# Patient Record
Sex: Male | Born: 1980 | Hispanic: Yes | Marital: Married | Smoking: Current some day smoker
Health system: Southern US, Community
[De-identification: ages and names within clinical notes are randomized; demographics above are authoritative.]

---

## 2012-10-10 ENCOUNTER — Emergency Department: Payer: Self-pay | Admitting: Emergency Medicine

## 2012-10-18 ENCOUNTER — Emergency Department: Payer: Self-pay | Admitting: Emergency Medicine

## 2012-10-18 LAB — BASIC METABOLIC PANEL
BUN: 12 mg/dL (ref 7–18)
Chloride: 106 mmol/L (ref 98–107)
EGFR (African American): >60
Glucose: 111 mg/dL — ABNORMAL HIGH (ref 65–99)
Osmolality: 274 (ref 275–301)
Potassium: 3.2 mmol/L — ABNORMAL LOW (ref 3.5–5.1)

## 2012-10-18 LAB — CBC
HCT: 52.2 % — ABNORMAL HIGH (ref 40.0–52.0)
HGB: 17.8 g/dL (ref 13.0–18.0)
MCH: 32.1 pg (ref 26.0–34.0)
MCHC: 34.2 g/dL (ref 32.0–36.0)
MCV: 94 fL (ref 80–100)
Platelet: 241 10*3/uL (ref 150–440)
RBC: 5.56 10*6/uL (ref 4.40–5.90)
WBC: 17.1 10*3/uL — ABNORMAL HIGH (ref 3.8–10.6)

## 2014-12-19 ENCOUNTER — Emergency Department: Payer: Self-pay

## 2014-12-19 ENCOUNTER — Encounter: Payer: Self-pay | Admitting: Emergency Medicine

## 2014-12-19 ENCOUNTER — Emergency Department
Admission: EM | Admit: 2014-12-19 | Discharge: 2014-12-19 | Disposition: A | Discharge status: Home / Self Care | Payer: Self-pay | Attending: Emergency Medicine | Admitting: Emergency Medicine

## 2014-12-19 DIAGNOSIS — J159 Unspecified bacterial pneumonia: Secondary | ICD-10-CM | POA: Insufficient documentation

## 2014-12-19 DIAGNOSIS — J189 Pneumonia, unspecified organism: Secondary | ICD-10-CM

## 2014-12-19 DIAGNOSIS — F17200 Nicotine dependence, unspecified, uncomplicated: Secondary | ICD-10-CM | POA: Insufficient documentation

## 2014-12-19 LAB — CBC
HEMATOCRIT: 45.2 % (ref 40.0–52.0)
HEMOGLOBIN: 15.8 g/dL (ref 13.0–18.0)
MCH: 32.6 pg (ref 26.0–34.0)
MCHC: 35 g/dL (ref 32.0–36.0)
MCV: 93.1 fL (ref 80.0–100.0)
Platelets: 150 10*3/uL (ref 150–440)
RBC: 4.86 MIL/uL (ref 4.40–5.90)
RDW: 12.5 % (ref 11.5–14.5)
WBC: 8.5 10*3/uL (ref 3.8–10.6)

## 2014-12-19 LAB — BASIC METABOLIC PANEL
ANION GAP: 8 (ref 5–15)
BUN: 11 mg/dL (ref 6–20)
CHLORIDE: 104 mmol/L (ref 101–111)
CO2: 23 mmol/L (ref 22–32)
Calcium: 9.5 mg/dL (ref 8.9–10.3)
Creatinine, Ser: 1.03 mg/dL (ref 0.61–1.24)
GFR calc Af Amer: >60 mL/min (ref >60)
GLUCOSE: 106 mg/dL — AB (ref 65–99)
POTASSIUM: 3.7 mmol/L (ref 3.5–5.1)
Sodium: 135 mmol/L (ref 135–145)

## 2014-12-19 LAB — TROPONIN I: Troponin I: <0.03 ng/mL (ref <0.031)

## 2014-12-19 MED ORDER — ALBUTEROL SULFATE HFA 108 (90 BASE) MCG/ACT IN AERS
2.0000 | INHALATION_SPRAY | RESPIRATORY_TRACT | Status: AC | PRN
Start: 2014-12-19 — End: ?

## 2014-12-19 MED ORDER — AZITHROMYCIN 250 MG PO TABS: ORAL_TABLET | ORAL | Status: AC

## 2014-12-19 NOTE — ED Provider Notes (Signed)
[facility] Emergency Department Provider Note  ____________________________________________  Time seen: Approximately 8:33 PM  I have reviewed the triage vital signs and the nursing notes.   HISTORY  Chief Complaint Chest Pain and Cough   Interpreter was used  HPI Jesse RubensCarlos Aguilar Armstrong is a 34 y.o. male who presents to the emergency department complaining of chest pain, cough, low-grade fever and chills 3 days. He states that symptoms began insidiously and have increased over the intervening period. He is taking over-the-counter medication with no relief. He states the cough is dry. Chest pain is described as a burning sensation in the center of his chest. He denies shortness of breath, headache, visual acuity changes, sore throat, shortness of breath, abdominal pain, nausea or vomiting.   History reviewed. No pertinent past medical history.  There are no active problems to display for this patient.   History reviewed. No pertinent past surgical history.  Current Outpatient Rx  Name  Route  Sig  Dispense  Refill  . albuterol (PROVENTIL HFA;VENTOLIN HFA) 108 (90 BASE) MCG/ACT inhaler   Inhalation   Inhale 2 puffs into the lungs every 4 (four) hours as needed for wheezing or shortness of breath.   1 Inhaler   0   . azithromycin (ZITHROMAX Z-PAK) 250 MG tablet      Take 2 tablets (500 mg) on  Day 1,  followed by 1 tablet (250 mg) once daily on Days 2 through 5.   6 each   0     Allergies Review of patient's allergies indicates no known allergies.  History reviewed. No pertinent family history.  Social History Social History  Substance Use Topics  . Smoking status: Current Some Day Smoker  . Smokeless tobacco: Never Used  . Alcohol Use: Yes     Comment: Weekends    Review of Systems Constitutional: Endorses fever/chills Eyes: No visual changes. ENT: No sore throat. Cardiovascular: Endorses chest pain. Respiratory: Denies shortness of  breath. Nurses cough. Gastrointestinal: No abdominal pain.  No nausea, no vomiting.  No diarrhea.  No constipation. Genitourinary: Negative for dysuria. Musculoskeletal: Negative for back pain. Skin: Negative for rash. Neurological: Negative for headaches, focal weakness or numbness.  10-point ROS otherwise negative.  ____________________________________________   PHYSICAL EXAM:  VITAL SIGNS: ED Triage Vitals  Enc Vitals Group     BP 12/19/14 1858 141/82 mmHg     Pulse Rate 12/19/14 1858 99     Resp 12/19/14 1858 22     Temp 12/19/14 1858 101.4 F (38.6 C)     Temp Source 12/19/14 1858 Oral     SpO2 12/19/14 1858 96 %     Weight 12/19/14 1858 180 lb (81.647 kg)     Height 12/19/14 1858 5\' 7"  (1.702 m)     Head Cir --      Peak Flow --      Pain Score 12/19/14 1859 9     Pain Loc --      Pain Edu? --      Excl. in GC? --     Constitutional: Alert and oriented. Well appearing and in no acute distress. Eyes: Conjunctivae are normal. PERRL. EOMI. Head: Atraumatic. Nose: No congestion/rhinnorhea. Mouth/Throat: Mucous membranes are moist.  Oropharynx non-erythematous. Neck: No stridor.   Hematological/Lymphatic/Immunilogical: Diffuse, mobile, nontender anterior cervical lymphadenopathy. Cardiovascular: Normal rate, regular rhythm. Grossly normal heart sounds.  Good peripheral circulation. Respiratory: Normal respiratory effort.  No retractions. Lungs with Scattered coarse breath sounds bilaterally. No rales or  rhonchi. No wheezing. No absent or decreased breath sounds. Gastrointestinal: Soft and nontender. No distention. No abdominal bruits. No CVA tenderness. Musculoskeletal: No lower extremity tenderness nor edema.  No joint effusions. Neurologic:  Normal speech and language. No gross focal neurologic deficits are appreciated. No gait instability. Skin:  Skin is warm, dry and intact. No rash noted. Psychiatric: Mood and affect are normal. Speech and behavior are  normal.  ____________________________________________   LABS (all labs ordered are listed, but only abnormal results are displayed)  Labs Reviewed  BASIC METABOLIC PANEL - Abnormal; Notable for the following:    Glucose, Bld 106 (*)    All other components within normal limits  CBC  TROPONIN I   ____________________________________________  EKG  EKG reveals normal sinus rhythm. No ST elevation or depression. PR, QRS, QT intervals within normal limits. No Q waves or delta waves present. ____________________________________________  RADIOLOGY  Chest x-ray Impression: No acute infiltrate or pulmonary edema. Increased bronchial markings without focal consolidation. ____________________________________________   PROCEDURES  Procedure(s) performed: None  Critical Care performed: No  ____________________________________________   INITIAL IMPRESSION / ASSESSMENT AND PLAN / ED COURSE  Pertinent labs & imaging results that were available during my care of the patient were reviewed by me and considered in my medical decision making (see chart for details).  Patient's history, symptoms, physical exam, laboratory results, EKG, and chest x-ray are consistent with community-acquired pneumonia. Advised patient of findings and diagnosis they verbalized understanding same. Patient will be placed on albuterol and antibiotics for symptom control. Patient verbalizes understanding of diagnosis and treatment plan and verbalizes compliance with same. ____________________________________________   FINAL CLINICAL IMPRESSION(S) / ED DIAGNOSES  Final diagnoses:  Community acquired pneumonia      Racheal Patches, PA-C 12/19/14 2042  Loleta Rose, MD 12/19/14 2322

## 2014-12-19 NOTE — ED Provider Notes (Signed)
-----------------------------------------   7:24 PM on 12/19/2014 -----------------------------------------  EKG reviewed and interpreted by myself shows normal sinus rhythm at 95 bpm, narrow QRS, normal axis, normal intervals, nonspecific ST changes. Overall non-concerning EKG  Minna AntisKevin Chablis Losh, MD 12/19/14 [id number]

## 2014-12-19 NOTE — ED Notes (Signed)
Pt presents with c/o chest tightness, cough, fever, and chills since yesterday. Pt states tightness in chest 9/10 that is in the middle of his chest.

## 2014-12-19 NOTE — Discharge Instructions (Signed)
Neumonía extrahospitalaria en los adultos °(Community-Acquired Pneumonia, Adult) °La neumonía es una infección en los pulmones. Hay diferentes tipos de neumonía. Uno de ellos puede desarrollarse mientras una persona está en el hospital. Un tipo diferente, llamado neumonía extrahospitalaria, evoluciona en las personas que no están o no han estado recientemente en el hospital u otro centro de salud.  °CAUSAS °La neumonía puede ser bacteriana, viral o micótica. Con frecuencia, la causa de la neumonía extrahospitalaria es la bacteria Streptococcus pneumoniae. Estas bacterias suelen transmitirse de una persona a otra al inhalar las gotitas que un individuo infectado libera al toser o estornudar. °FACTORES DE RIESGO °Es más probable que la enfermedad se manifieste en: °· Las personas que padecen enfermedades crónicas, como enfermedad pulmonar obstructiva crónica (EPOC), asma, insuficiencia cardíaca congestiva, fibroso quística, diabetes o enfermedad renal. °· Las personas que tienen el VIH en etapa inicial o avanzada. °· Las personas que tienen anemia drepanocítica. °· Las personas a las que se les extrajo el bazo (esplenectomía). °· Las personas cuya higiene dental es deficiente. °· Las personas que sufren enfermedades que aumentan el riesgo de inspirar (aspirar) las secreciones de la propia boca y la nariz.   °· Las personas cuyo sistema inmunitario está debilitado (inmunodeprimido). °· Los fumadores. °· Las personas que viajan a regiones donde es frecuente la existencia de los gérmenes que causan la neumonía. °· Las personas que tienen contacto con hábitat de animales o con animales que son portadores de los gérmenes que causan neumonía, entre ellos, pájaros, murciélagos, conejos, gatos y animales de granja. °SÍNTOMAS °Los síntomas de esta enfermedad incluyen lo siguiente: °· Tos seca. °· Tos con expectoración (productiva). °· Fiebre. °· Sudoración. °· Dolor de pecho, especialmente al respirar profundamente o al  toser. °· Respiración rápida o dificultad para respirar. °· Falta de aire. °· Escalofríos. °· Fatiga. °· Dolores musculares. °DIAGNÓSTICO °El médico le hará una historia clínica y un examen físico. También pueden hacerle otros estudios, por ejemplo: °· Estudios de diagnóstico por imágenes, entre ellos, radiografías. °· Análisis para controlar el nivel de oxígeno y de otros gases en la sangre. °· Otros análisis de sangre, de la mucosidad (esputo), el líquido que rodea los pulmones (líquido pleural) y la orina. °Si la neumonía es grave, se pueden hacer otros estudios para identificar la causa específica de la enfermedad. °TRATAMIENTO °El tipo de tratamiento que se administra depende de muchos factores, por ejemplo, la causa de la neumonía, los medicamentos que toma y otras enfermedades que padezca. En el caso de la mayoría de los adultos, el tratamiento de la neumonía y la recuperación pueden hacerse en la casa. En algunos casos, el tratamiento debe administrarse en un hospital. El tratamiento puede incluir lo siguiente: °· Antibióticos, si la neumonía fue causada por bacterias. °· Antivirales, si la neumonía fue causada por un virus. °· Medicamentos que se administran por vía oral o por vía intravenosa (IV). °· Oxígeno. °· Terapia respiratoria. °Aunque es poco frecuente, el tratamiento de la neumonía grave puede incluir lo siguiente: °· Asistencia respiratoria mecánica. Este tratamiento se realiza si no respira bien por sí solo y no puede mantener un nivel de oxígeno adecuado. °· Toracocentesis. Este procedimiento se realiza para extraer el líquido que rodea uno o ambos pulmones, a fin de mejorar la respiración. °INSTRUCCIONES PARA EL CUIDADO EN EL HOGAR °· Tome los medicamentos de venta libre y los recetados solamente como se lo haya indicado el médico. °· Tome los medicamentos para la tos solamente si no puede dormir bien. Debe entender que   los medicamentos para la tos pueden minar la capacidad natural del  organismo de eliminar la mucosidad de los pulmones. °· Si le recetaron un antibiótico, tómelo como se lo haya indicado el médico. No deje de tomar los antibióticos aunque comience a sentirse mejor. °· De noche, duerma semisentado. Intente dormir en un sillón reclinable o póngase algunas almohadas debajo de la cabeza. °· No consuma productos que contengan tabaco, incluidos cigarrillos, tabaco de mascar y cigarrillos electrónicos. Si necesita ayuda para dejar de fumar, consulte al médico. °· Beba suficiente agua para mantener la orina clara o de color amarillo pálido. Esto ayudará a fluidificar las secreciones mucosas de los pulmones. °PREVENCIÓN °Existen formas de reducir el riesgo de tener neumonía extrahospitalaria. Considere la posibilidad de aplicarse la vacuna antineumocócica reúne estos requisitos: °· Es mayor de 65 años. °· Es mayor de 19 años y está recibiendo tratamiento oncológico, tiene enfermedad pulmonar crónica u otros trastornos que le afectan el sistema inmunitario. Pregúntele al médico si esto es válido para su caso. °Hay diferentes tipos de vacunas antineumocócicas y cronogramas para su aplicación. Pregúntele al médico cuál es la opción de vacunación más adecuada para usted. °También puede evitar contraer neumonía extrahospitalaria si toma las siguientes medidas: °· Se aplica la vacuna antigripal todos los años. Pregúntele al médico cuál es el tipo de vacuna antigripal más adecuado para usted. °· Visita al dentista periódicamente. °· Se lava las manos con frecuencia. Utilice un desinfectante para manos si no dispone de agua y jabón. °SOLICITE ATENCIÓN MÉDICA SI: °· Tiene fiebre. °· No duerme bien porque no es posible controlar la tos con medicamentos para la tos. °SOLICITE ATENCIÓN MÉDICA DE INMEDIATO SI: °· Experimenta un empeoramiento en la falta de aire. °· El dolor de pecho es cada vez más intenso. °· La enfermedad empeora, especialmente si usted es un adulto mayor o su sistema inmunitario está  debilitado. °· Tose y escupe sangre. °  °Esta información no tiene como fin reemplazar el consejo del médico. Asegúrese de hacerle al médico cualquier pregunta que tenga. °  °Document Released: 10/19/2004 Document Revised: 09/30/2014 °Elsevier Interactive Patient Education ©2016 [employer] ° ° °

## 2014-12-19 NOTE — ED Notes (Signed)
Interpreter requested at this time to triage.

## 2014-12-19 NOTE — ED Notes (Signed)
Patient with no complaints at this time. Respirations even and unlabored. Skin warm/dry. Discharge instructions reviewed with patient at this time. Patient given opportunity to voice concerns/ask questions. Patient discharged at this time and left Emergency Department with steady gait.   

## 2017-04-12 ENCOUNTER — Emergency Department
Admission: EM | Admit: 2017-04-12 | Discharge: 2017-04-12 | Disposition: A | Discharge status: Home / Self Care | Payer: Self-pay | Attending: Emergency Medicine | Admitting: Emergency Medicine

## 2017-04-12 ENCOUNTER — Emergency Department: Payer: Self-pay

## 2017-04-12 ENCOUNTER — Encounter: Payer: Self-pay | Admitting: Emergency Medicine

## 2017-04-12 ENCOUNTER — Other Ambulatory Visit: Payer: Self-pay

## 2017-04-12 DIAGNOSIS — J111 Influenza due to unidentified influenza virus with other respiratory manifestations: Secondary | ICD-10-CM | POA: Insufficient documentation

## 2017-04-12 DIAGNOSIS — J101 Influenza due to other identified influenza virus with other respiratory manifestations: Secondary | ICD-10-CM

## 2017-04-12 DIAGNOSIS — F17210 Nicotine dependence, cigarettes, uncomplicated: Secondary | ICD-10-CM | POA: Insufficient documentation

## 2017-04-12 LAB — CBC
HEMATOCRIT: 47.3 % (ref 40.0–52.0)
Hemoglobin: 16.6 g/dL (ref 13.0–18.0)
MCH: 32.8 pg (ref 26.0–34.0)
MCHC: 35.2 g/dL (ref 32.0–36.0)
MCV: 93.2 fL (ref 80.0–100.0)
PLATELETS: 157 10*3/uL (ref 150–440)
RBC: 5.07 MIL/uL (ref 4.40–5.90)
RDW: 12.6 % (ref 11.5–14.5)
WBC: 6.5 10*3/uL (ref 3.8–10.6)

## 2017-04-12 LAB — BASIC METABOLIC PANEL
Anion gap: 10 (ref 5–15)
BUN: 12 mg/dL (ref 6–20)
CHLORIDE: 109 mmol/L (ref 101–111)
CO2: 18 mmol/L — AB (ref 22–32)
CREATININE: 1.17 mg/dL (ref 0.61–1.24)
Calcium: 9 mg/dL (ref 8.9–10.3)
GFR calc Af Amer: >60 mL/min (ref >60)
GFR calc non Af Amer: >60 mL/min (ref >60)
GLUCOSE: 96 mg/dL (ref 65–99)
Potassium: 3.8 mmol/L (ref 3.5–5.1)
Sodium: 137 mmol/L (ref 135–145)

## 2017-04-12 LAB — TROPONIN I: Troponin I: <0.03 ng/mL (ref <0.03)

## 2017-04-12 LAB — INFLUENZA PANEL BY PCR (TYPE A & B)
Influenza A By PCR: POSITIVE — AB
Influenza B By PCR: NEGATIVE

## 2017-04-12 MED ORDER — OSELTAMIVIR PHOSPHATE 75 MG PO CAPS: 75.0000 mg | ORAL_CAPSULE | Freq: Two times a day (BID) | ORAL | 0 refills | Status: AC

## 2017-04-12 MED ORDER — HYDROCOD POLST-CPM POLST ER 10-8 MG/5ML PO SUER
ORAL | Status: AC
  Administered 2017-04-12: 5 mL via ORAL
  Filled 2017-04-12: qty 5

## 2017-04-12 MED ORDER — HYDROCOD POLST-CPM POLST ER 10-8 MG/5ML PO SUER: 5.0000 mL | Freq: Two times a day (BID) | ORAL | 0 refills | Status: AC

## 2017-04-12 MED ORDER — HYDROCOD POLST-CPM POLST ER 10-8 MG/5ML PO SUER
5.0000 mL | Freq: Once | ORAL | Status: AC
  Administered 2017-04-12: 5 mL via ORAL

## 2017-04-12 NOTE — ED Notes (Signed)
Pt in NAD when brought back to treatment room. Wheelchair used and pt in mask. Pt is diaphoretic but no SOB or increased WOB noted. Pt able to stand and pivot from wheelchair to treatment bed. Bed locked and in lowest position. Call bell in reach. Family at bedside.

## 2017-04-12 NOTE — ED Provider Notes (Signed)
[facility] Emergency Department Provider Note       Time seen: ----------------------------------------- 4:05 PM on 04/12/2017 -----------------------------------------   I have reviewed the triage vital signs and the nursing notes.  HISTORY   Chief Complaint Chest Pain    HPI Ileene RubensCarlos Aguilar Ramirez is a 37 y.o. male with no significant past medical history who presents to the ED for chest pain.  Patient states symptoms started yesterday.  He feels like his chest pain started before his cough.  He has had significant and persistent coughing.  Yesterday he had sputum production but none today.  Chest pain is worse now with coughing.  He has had body aches and possibly a fever.  He denies vomiting or diarrhea but does have a sore throat  History reviewed. No pertinent past medical history.  There are no active problems to display for this patient.   History reviewed. No pertinent surgical history.  Allergies Patient has no known allergies.  Social History Social History   Tobacco Use  . Smoking status: Current Some Day Smoker  . Smokeless tobacco: Never Used  Substance Use Topics  . Alcohol use: Yes    Comment: Weekends  . Drug use: No    Review of Systems Constitutional: Positive for fever ENT: Positive for congestion and sore throat Cardiovascular: Positive for chest pain Respiratory: Negative for shortness of breath. Gastrointestinal: Negative for abdominal pain, vomiting and diarrhea. Musculoskeletal: Negative for back pain.  Positive for muscle aches Skin: Negative for rash. Neurological: Negative for headaches, focal weakness or numbness.  All systems negative/normal/unremarkable except as stated in the HPI  ____________________________________________   PHYSICAL EXAM:  VITAL SIGNS: ED Triage Vitals  Enc Vitals Group     BP 04/12/17 1316 124/79     Pulse Rate 04/12/17 1316 (!) 106     Resp 04/12/17 1316 20     Temp  04/12/17 1316 98.6 F (37 C)     Temp Source 04/12/17 1316 Oral     SpO2 04/12/17 1316 99 %     Weight 04/12/17 1315 190 lb (86.2 kg)     Height 04/12/17 1315 5\' 11"  (1.803 m)     Head Circumference --      Peak Flow --      Pain Score 04/12/17 1310 9     Pain Loc --      Pain Edu? --      Excl. in GC? --    Constitutional: Alert and oriented. Well appearing and in no distress. Eyes: Conjunctivae are normal. Normal extraocular movements. ENT   Head: Normocephalic and atraumatic.   Nose: No congestion/rhinnorhea.   Mouth/Throat: Mucous membranes are moist.   Neck: No stridor. Cardiovascular: Normal rate, regular rhythm. No murmurs, rubs, or gallops. Respiratory: Normal respiratory effort without tachypnea nor retractions. Breath sounds are clear and equal bilaterally. No wheezes/rales/rhonchi. Gastrointestinal: Soft and nontender. Normal bowel sounds Musculoskeletal: Nontender with normal range of motion in extremities. No lower extremity tenderness nor edema. Neurologic:  Normal speech and language. No gross focal neurologic deficits are appreciated.  Skin:  Skin is warm, dry and intact. No rash noted. Psychiatric: Mood and affect are normal. Speech and behavior are normal.  ____________________________________________  EKG: Interpreted by me.  Sinus rhythm the rate of 91 bpm, normal PR interval, normal QRS, normal QT  ____________________________________________  ED COURSE:  As part of my medical decision making, I reviewed the following data within the electronic MEDICAL RECORD NUMBER History obtained from family if  available, nursing notes, old chart and ekg, as well as notes from prior ED visits. Patient presented for chest pain and flulike symptoms, we will assess with labs and imaging as indicated at this time.   Procedures ____________________________________________   LABS (pertinent positives/negatives)  Labs Reviewed  BASIC METABOLIC PANEL - Abnormal;  Notable for the following components:      Result Value   CO2 18 (*)    All other components within normal limits  INFLUENZA PANEL BY PCR (TYPE A & B) - Abnormal; Notable for the following components:   Influenza A By PCR POSITIVE (*)    All other components within normal limits  CBC  TROPONIN I    RADIOLOGY Images were viewed by me  Chest x-ray is normal  ____________________________________________  DIFFERENTIAL DIAGNOSIS   Influenza, URI, bronchitis, pneumonia, chest wall pain  FINAL ASSESSMENT AND PLAN  Influenza   Plan: The patient had presented for chest pain and cough. Patient's labs were normal with the exception of serum bicarb and positive influenza test. Patient's imaging was negative.  Patient will be discharged with Tamiflu and Tussionex.  He is stable for outpatient follow-up.   Ulice Dash, MD   Note: This note was generated in part or whole with voice recognition software. Voice recognition is usually quite accurate but there are transcription errors that can and very often do occur. I apologize for any typographical errors that were not detected and corrected.     Emily Filbert, MD 04/12/17 [phone]

## 2017-04-12 NOTE — ED Triage Notes (Signed)
Pt here for chest pain. Coughing entire triage but reports chest pain started before cough. No fevers.  Reports sweating before arrival to ED, none noted currently.  No fever known.  Pain is constant.

## 2017-11-17 IMAGING — CR DG CHEST 2V
2 series · 2 of 2 positions shown · non-contrast
Comparison: None.

CLINICAL DATA: Chest pain, cough, fever, chills

EXAM:
CHEST  2 VIEW

[chest pa]
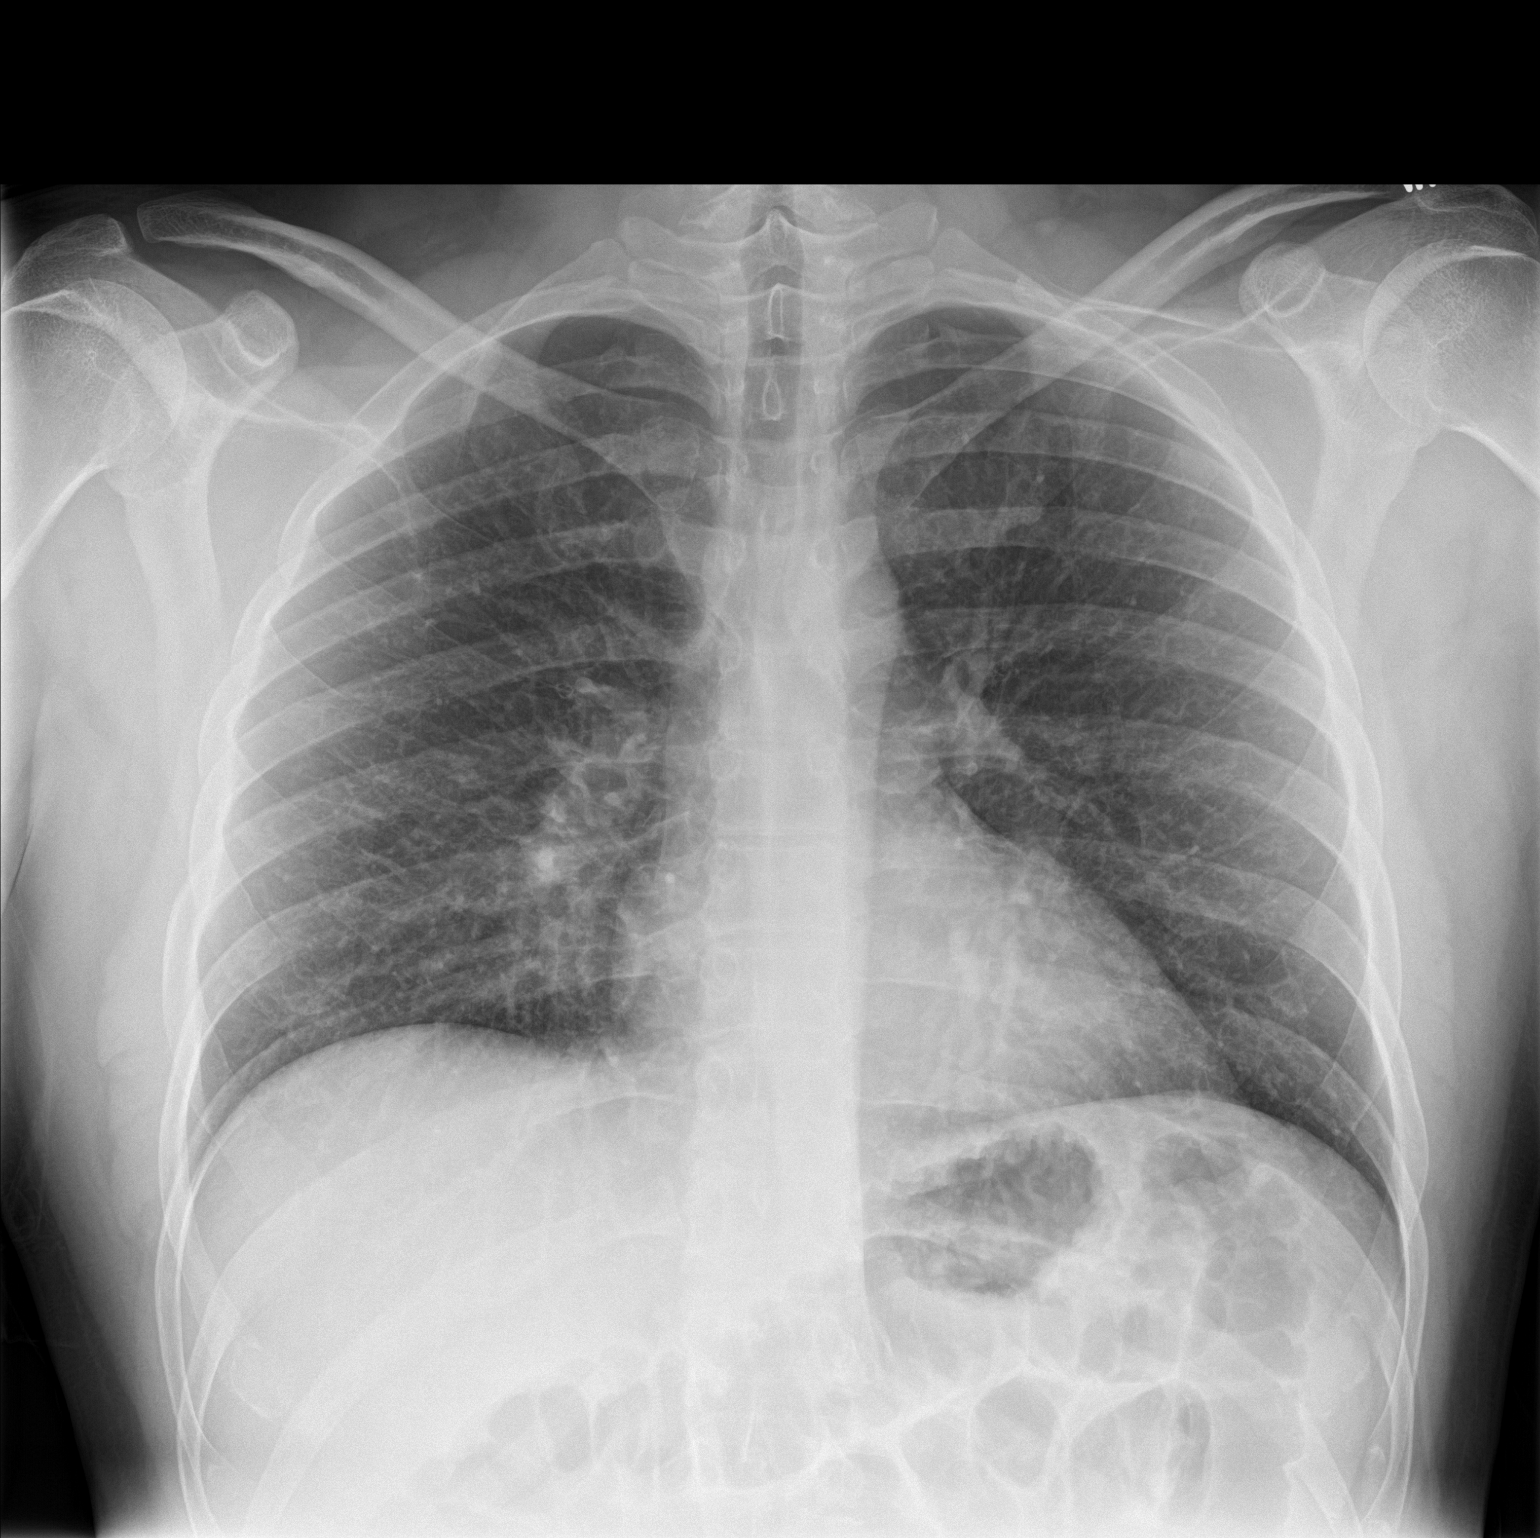

[chest lat]
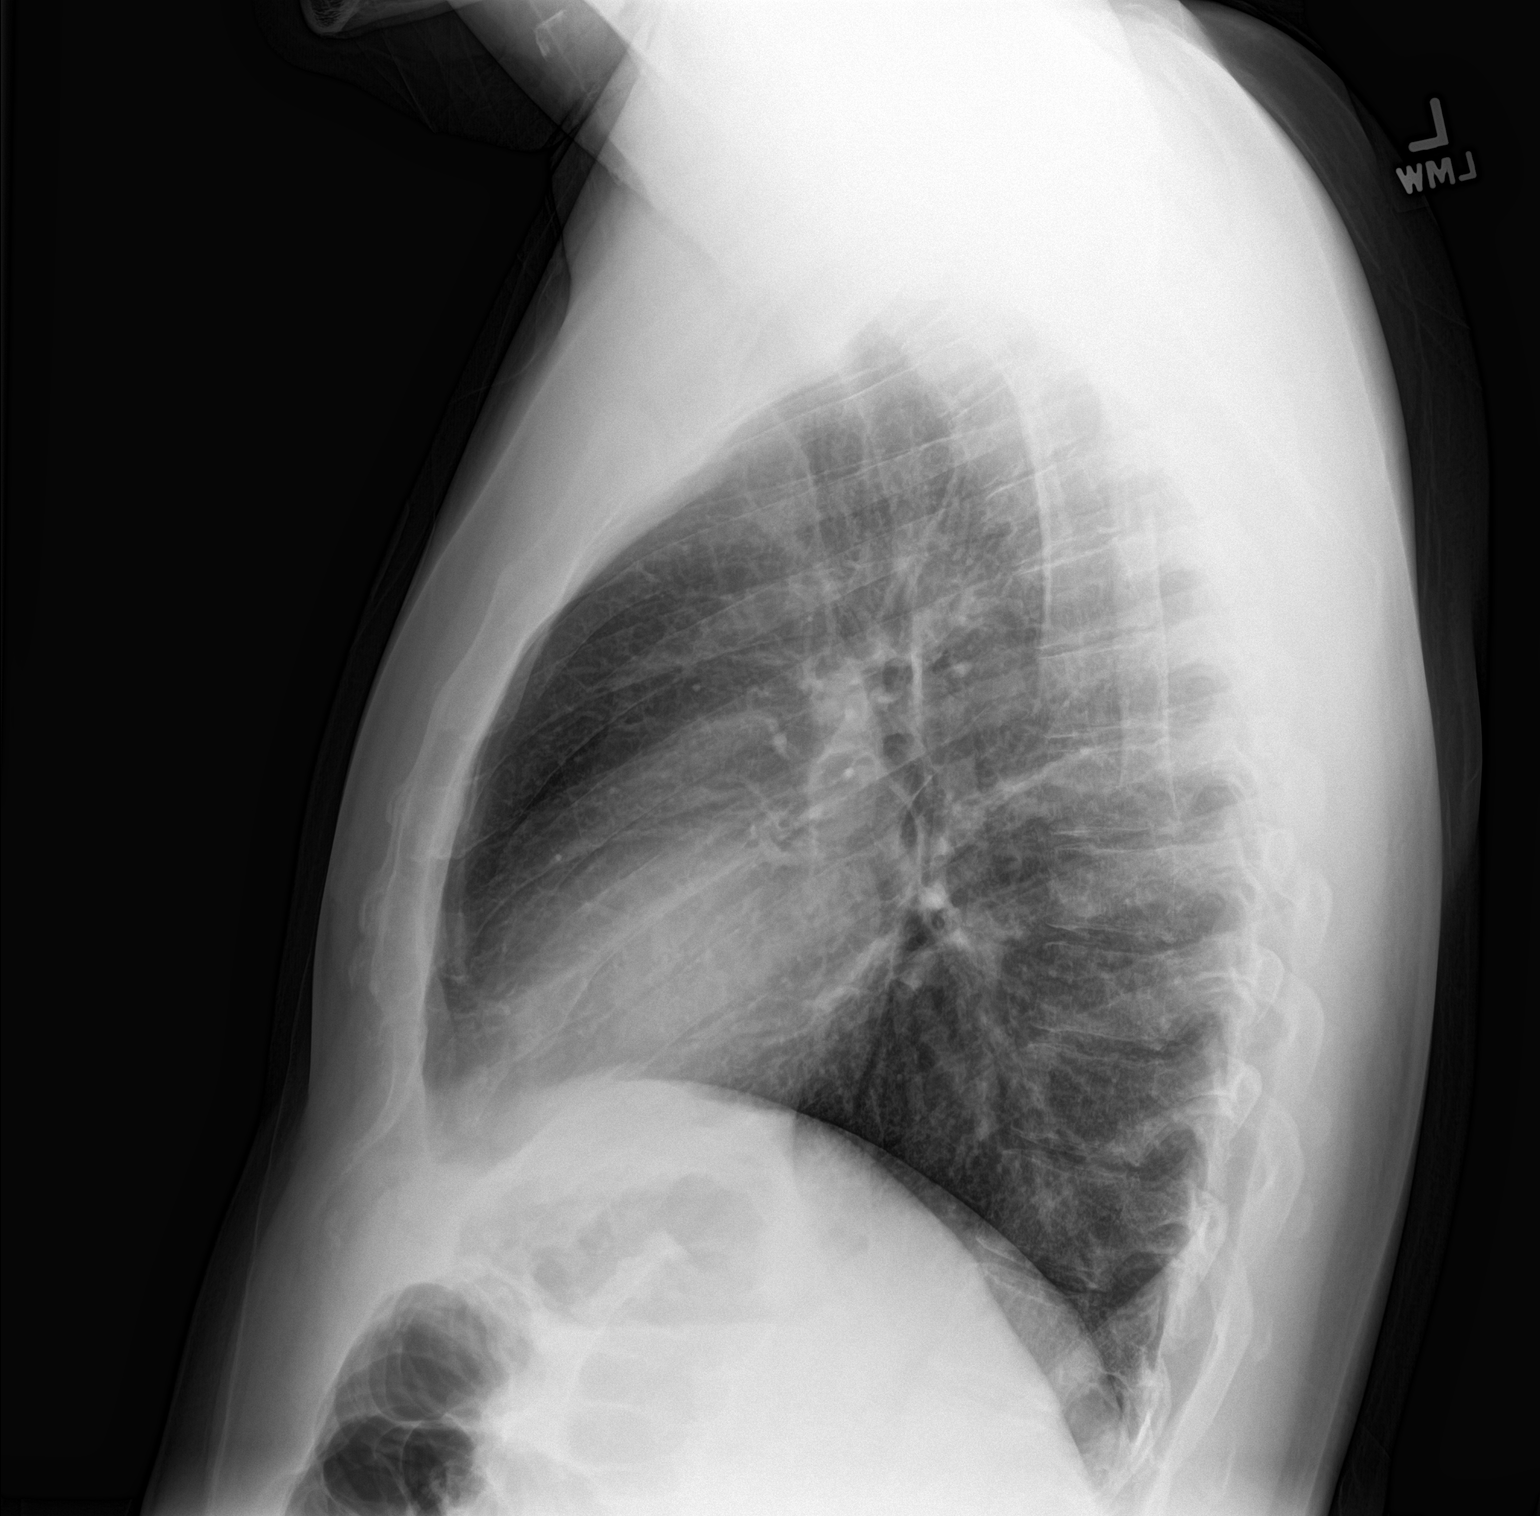

[2 of 2 positions shown; findings below may reference images not displayed]

FINDINGS: Cardiomediastinal silhouette is unremarkable. No acute infiltrate or
pleural effusion. No pulmonary edema. Mild infrahilar increased
bronchial markings without focal consolidation.
IMPRESSION: No acute infiltrate or pulmonary edema. Mild infrahilar increased
bronchial markings without focal consolidation.

## 2018-03-21 ENCOUNTER — Encounter: Payer: Self-pay | Admitting: Emergency Medicine

## 2018-03-21 ENCOUNTER — Other Ambulatory Visit: Payer: Self-pay

## 2018-03-21 ENCOUNTER — Emergency Department
Admission: EM | Admit: 2018-03-21 | Discharge: 2018-03-21 | Disposition: A | Discharge status: Home / Self Care | Payer: Self-pay | Attending: Emergency Medicine | Admitting: Emergency Medicine

## 2018-03-21 DIAGNOSIS — W228XXA Striking against or struck by other objects, initial encounter: Secondary | ICD-10-CM | POA: Insufficient documentation

## 2018-03-21 DIAGNOSIS — Y93H9 Activity, other involving exterior property and land maintenance, building and construction: Secondary | ICD-10-CM | POA: Insufficient documentation

## 2018-03-21 DIAGNOSIS — Y929 Unspecified place or not applicable: Secondary | ICD-10-CM | POA: Insufficient documentation

## 2018-03-21 DIAGNOSIS — Z79899 Other long term (current) drug therapy: Secondary | ICD-10-CM | POA: Insufficient documentation

## 2018-03-21 DIAGNOSIS — T1502XA Foreign body in cornea, left eye, initial encounter: Secondary | ICD-10-CM | POA: Insufficient documentation

## 2018-03-21 DIAGNOSIS — F17200 Nicotine dependence, unspecified, uncomplicated: Secondary | ICD-10-CM | POA: Insufficient documentation

## 2018-03-21 DIAGNOSIS — Y990 Civilian activity done for income or pay: Secondary | ICD-10-CM | POA: Insufficient documentation

## 2018-03-21 MED ORDER — FLUORESCEIN-BENOXINATE 0.25-0.4 % OP SOLN
1.0000 [drp] | Freq: Once | OPHTHALMIC | Status: AC
  Administered 2018-03-21: 1 [drp] via OPHTHALMIC
  Filled 2018-03-21: qty 5

## 2018-03-21 MED ORDER — TETRACAINE HCL 0.5 % OP SOLN
1.0000 [drp] | Freq: Once | OPHTHALMIC | Status: AC
  Administered 2018-03-21: 1 [drp] via OPHTHALMIC
  Filled 2018-03-21: qty 4

## 2018-03-21 MED ORDER — EYE WASH OPHTH SOLN
1.0000 [drp] | OPHTHALMIC | Status: DC | PRN
Start: 2018-03-21 — End: 2018-03-22
  Administered 2018-03-21: 1 [drp] via OPHTHALMIC
  Filled 2018-03-21: qty 118

## 2018-03-21 MED ORDER — BACITRACIN-POLYMYXIN B 500-10000 UNIT/GM OP OINT
TOPICAL_OINTMENT | Freq: Once | OPHTHALMIC | Status: AC
  Administered 2018-03-21: 21:00:00 via OPHTHALMIC
  Filled 2018-03-21: qty 3.5

## 2018-03-21 NOTE — ED Notes (Signed)
Pt unable to provide chain of custody from employer for workers comp as well as we are unable to find employer in out profile system. Pt informed to reach out to supervisor due to workers comp needs. Pt provides verbal understanding.

## 2018-03-21 NOTE — Discharge Instructions (Addendum)
Apply eye ointment before you go to sleep.  At 8:30 in the morning call ophthalmology and tell them you follow-up in the emergency room and it would tell you what time to come and see Dr. Lara Mulch or his associate to have foreign body removed.

## 2018-03-21 NOTE — ED Triage Notes (Signed)
Patient ambulatory to triage with steady gait, without difficulty or distress noted; per [facility] interpreter, pt reports yesterday at work, got something in eye, c/o left eye pain; pt employed with [employer] [phone] office)--employer not in our workers comp profile; called office with no answer, called supervisor Brain Hilts [phone]) with no answer, message left to return call; visual acuity 20/20 right eye, 20/25 left eye

## 2018-03-21 NOTE — ED Provider Notes (Signed)
[facility] Emergency Department Provider Note   ____________________________________________   First MD Initiated Contact with Patient 03/21/18 [phone]     (approximate)  I have reviewed the triage vital signs and the nursing notes.   HISTORY  Chief Complaint No chief complaint on file.    HPI Jesse Armstrong is a 38 y.o. male patient presents with foreign body left eye for 2 days.   Patient states while installing air-conditioning duct insulation fell into his eye.  Patient denies vision changes.  Patient was seen by urgent care clinic and sent to this department for removal.   History reviewed. No pertinent past medical history.  There are no active problems to display for this patient.   History reviewed. No pertinent surgical history.  Prior to Admission medications   Medication Sig Start Date End Date Taking? Authorizing Provider  albuterol (PROVENTIL HFA;VENTOLIN HFA) 108 (90 BASE) MCG/ACT inhaler Inhale 2 puffs into the lungs every 4 (four) hours as needed for wheezing or shortness of breath. 12/19/14   Cuthriell, Delorise Royals, PA-C  azithromycin (ZITHROMAX Z-PAK) 250 MG tablet Take 2 tablets (500 mg) on  Day 1,  followed by 1 tablet (250 mg) once daily on Days 2 through 5. 12/19/14   Cuthriell, Delorise Royals, PA-C  chlorpheniramine-HYDROcodone (TUSSIONEX PENNKINETIC ER) 10-8 MG/5ML SUER Take 5 mLs by mouth 2 (two) times daily. 04/12/17   Emily Filbert, MD    Allergies Patient has no known allergies.  No family history on file.  Social History Social History   Tobacco Use  . Smoking status: Current Some Day Smoker  . Smokeless tobacco: Never Used  Substance Use Topics  . Alcohol use: Yes    Comment: Weekends  . Drug use: No    Review of Systems Constitutional: No fever/chills Eyes: No visual changes.  Foreign body left eye. ENT: No sore throat. Cardiovascular: Denies chest pain. Respiratory: Denies shortness of  breath. Gastrointestinal: No abdominal pain.  No nausea, no vomiting.  No diarrhea.  No constipation. Genitourinary: Negative for dysuria. Musculoskeletal: Negative for back pain. Skin: Negative for rash. Neurological: Negative for headaches, focal weakness or numbness.   ____________________________________________   PHYSICAL EXAM:  VITAL SIGNS: ED Triage Vitals  Enc Vitals Group     BP 03/21/18 1908 (!) 133/100     Pulse Rate 03/21/18 1908 70     Resp 03/21/18 1908 18     Temp 03/21/18 1908 98.2 F (36.8 C)     Temp Source 03/21/18 1908 Oral     SpO2 03/21/18 1908 99 %     Weight 03/21/18 1906 196 lb (88.9 kg)     Height 03/21/18 1906 5\' 8"  (1.727 m)     Head Circumference --      Peak Flow --      Pain Score 03/21/18 1906 8     Pain Loc --      Pain Edu? --      Excl. in GC? --     Constitutional: Alert and oriented. Well appearing and in no acute distress. Eyes: Conjunctivae are normal. PERRL. EOMI. see visual acuity in chart.  Visible foreign body left eye. Hematological/Lymphatic/Immunilogical: No cervical lymphadenopathy. Cardiovascular: Normal rate, regular rhythm. Grossly normal heart sounds.  Good peripheral circulation. Respiratory: Normal respiratory effort.  No retractions. Lungs CTAB. Skin:  Skin is warm, dry and intact. No rash noted. Psychiatric: Mood and affect are normal. Speech and behavior are normal.  ____________________________________________   LABS (all labs  ordered are listed, but only abnormal results are displayed)  Labs Reviewed - No data to display ____________________________________________  EKG   ____________________________________________  RADIOLOGY  ED MD interpretation:    Official radiology report(s): No results found.  ____________________________________________   PROCEDURES  Procedure(s) performed (including Critical Care):  Procedures   ____________________________________________   INITIAL IMPRESSION  / ASSESSMENT AND PLAN / ED COURSE  As part of my medical decision making, I reviewed the following data within the electronic MEDICAL RECORD NUMBER     Foreign body left eye.  I was unable to remove foreign body using Q-tip and blunt needle.  Discussed patient with on-call ophthalmologist (Dr. Lara Mulch).  Advised to put eye ointment and have patient report in the morning for definitive evaluation and treatment.      ____________________________________________   FINAL CLINICAL IMPRESSION(S) / ED DIAGNOSES  Final diagnoses:  Corneal foreign body with residual material, left, initial encounter     ED Discharge Orders    None       Note:  This document was prepared using Dragon voice recognition software and may include unintentional dictation errors.    Joni Reining, PA-C 03/21/18 2010    Dionne Bucy, MD 03/21/18 2326

## 2018-03-21 NOTE — ED Triage Notes (Signed)
First Nurse Note:  Patient has foreign body to left eye per [facility].  Patient ambulatory to triage.  No obvious distress at this time.

## 2018-03-21 NOTE — ED Notes (Signed)
Interpreter requested 

## 2020-03-11 IMAGING — CR DG CHEST 2V
1 series · 2 of 2 positions shown · non-contrast
Comparison: 12/19/2014

CLINICAL DATA: Chest pain, cough, and fever.

EXAM:
CHEST - 2 VIEW

[Series 1: dg chest 2 view · 0.14mm/px · 2 of 2 slices shown]
[im 1/2]
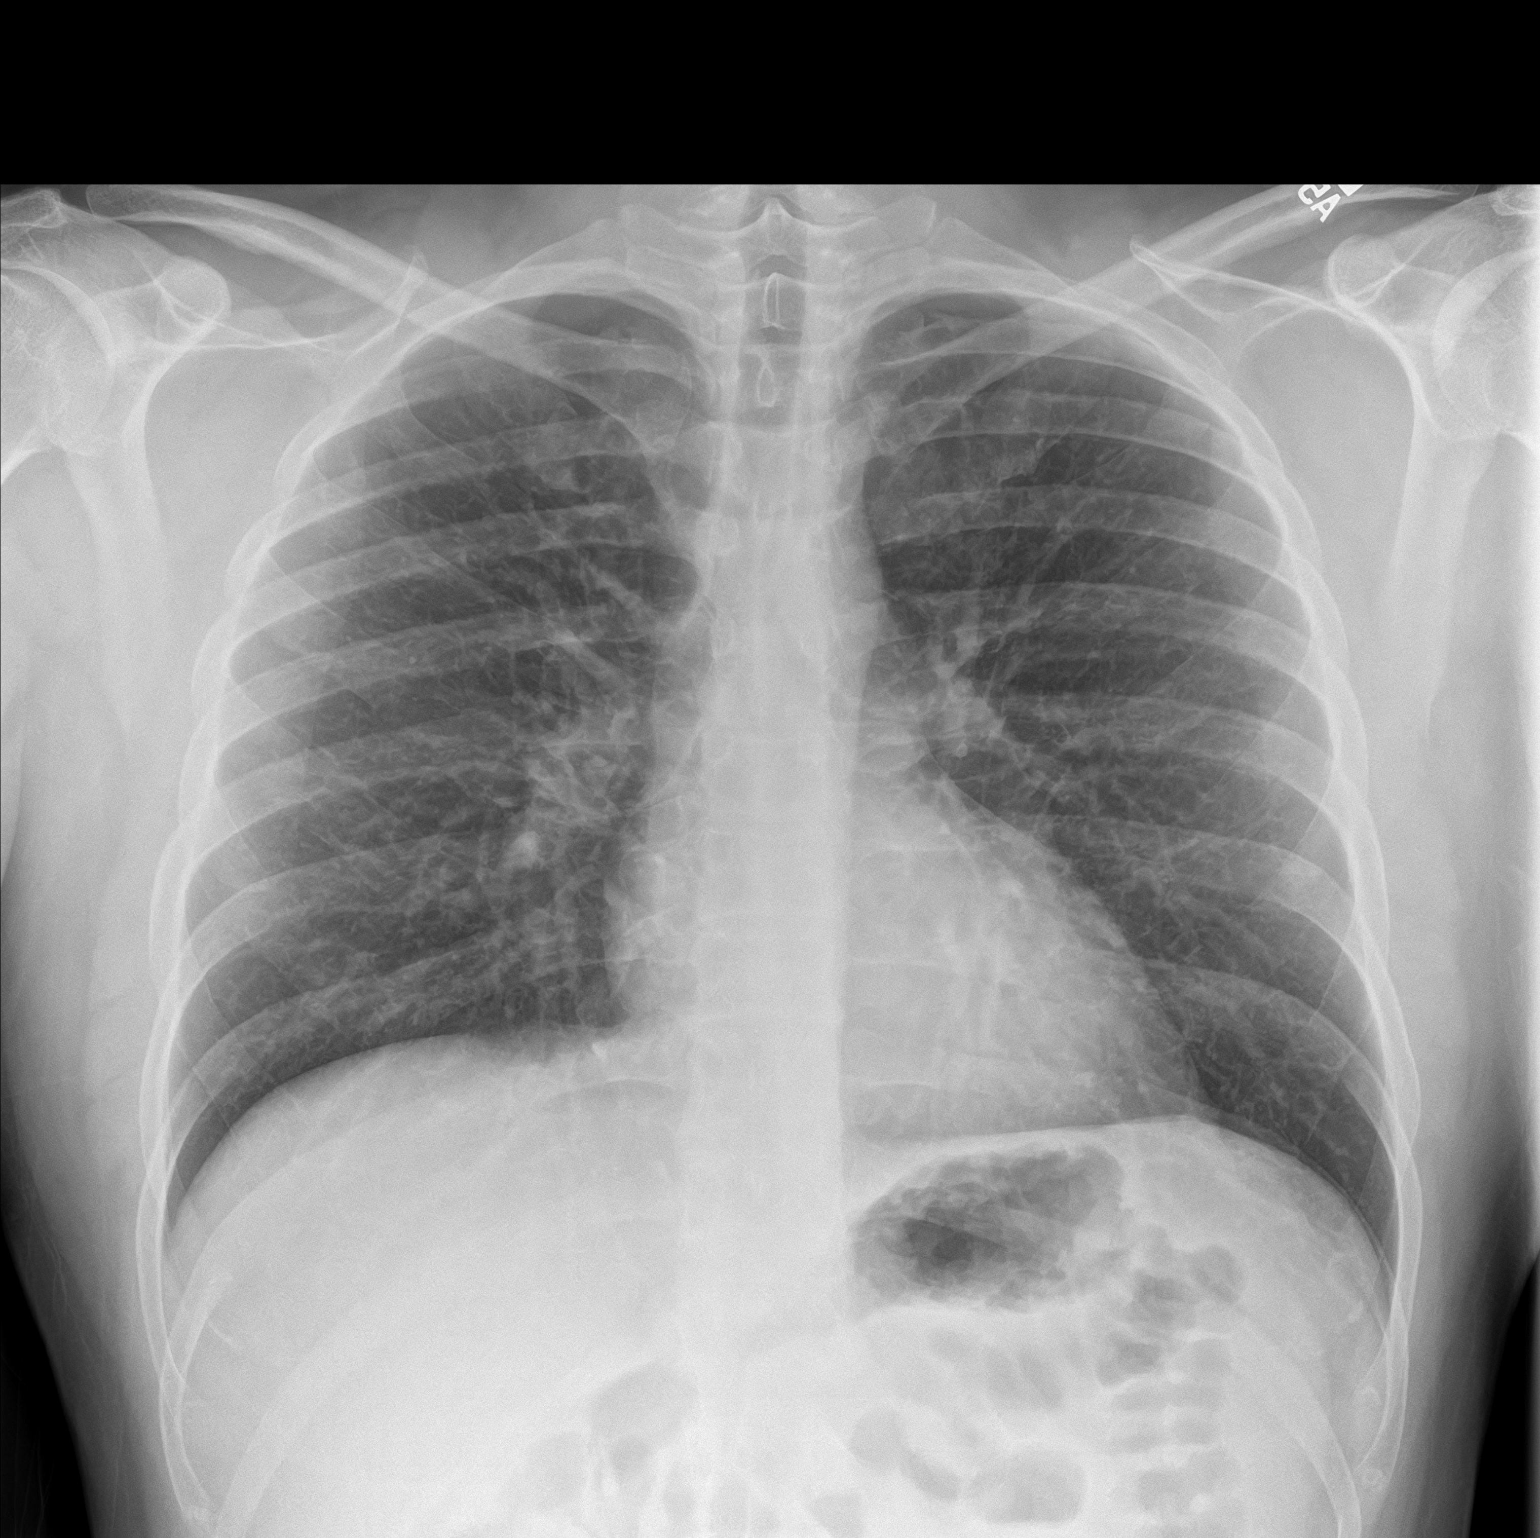
[im 2/2]
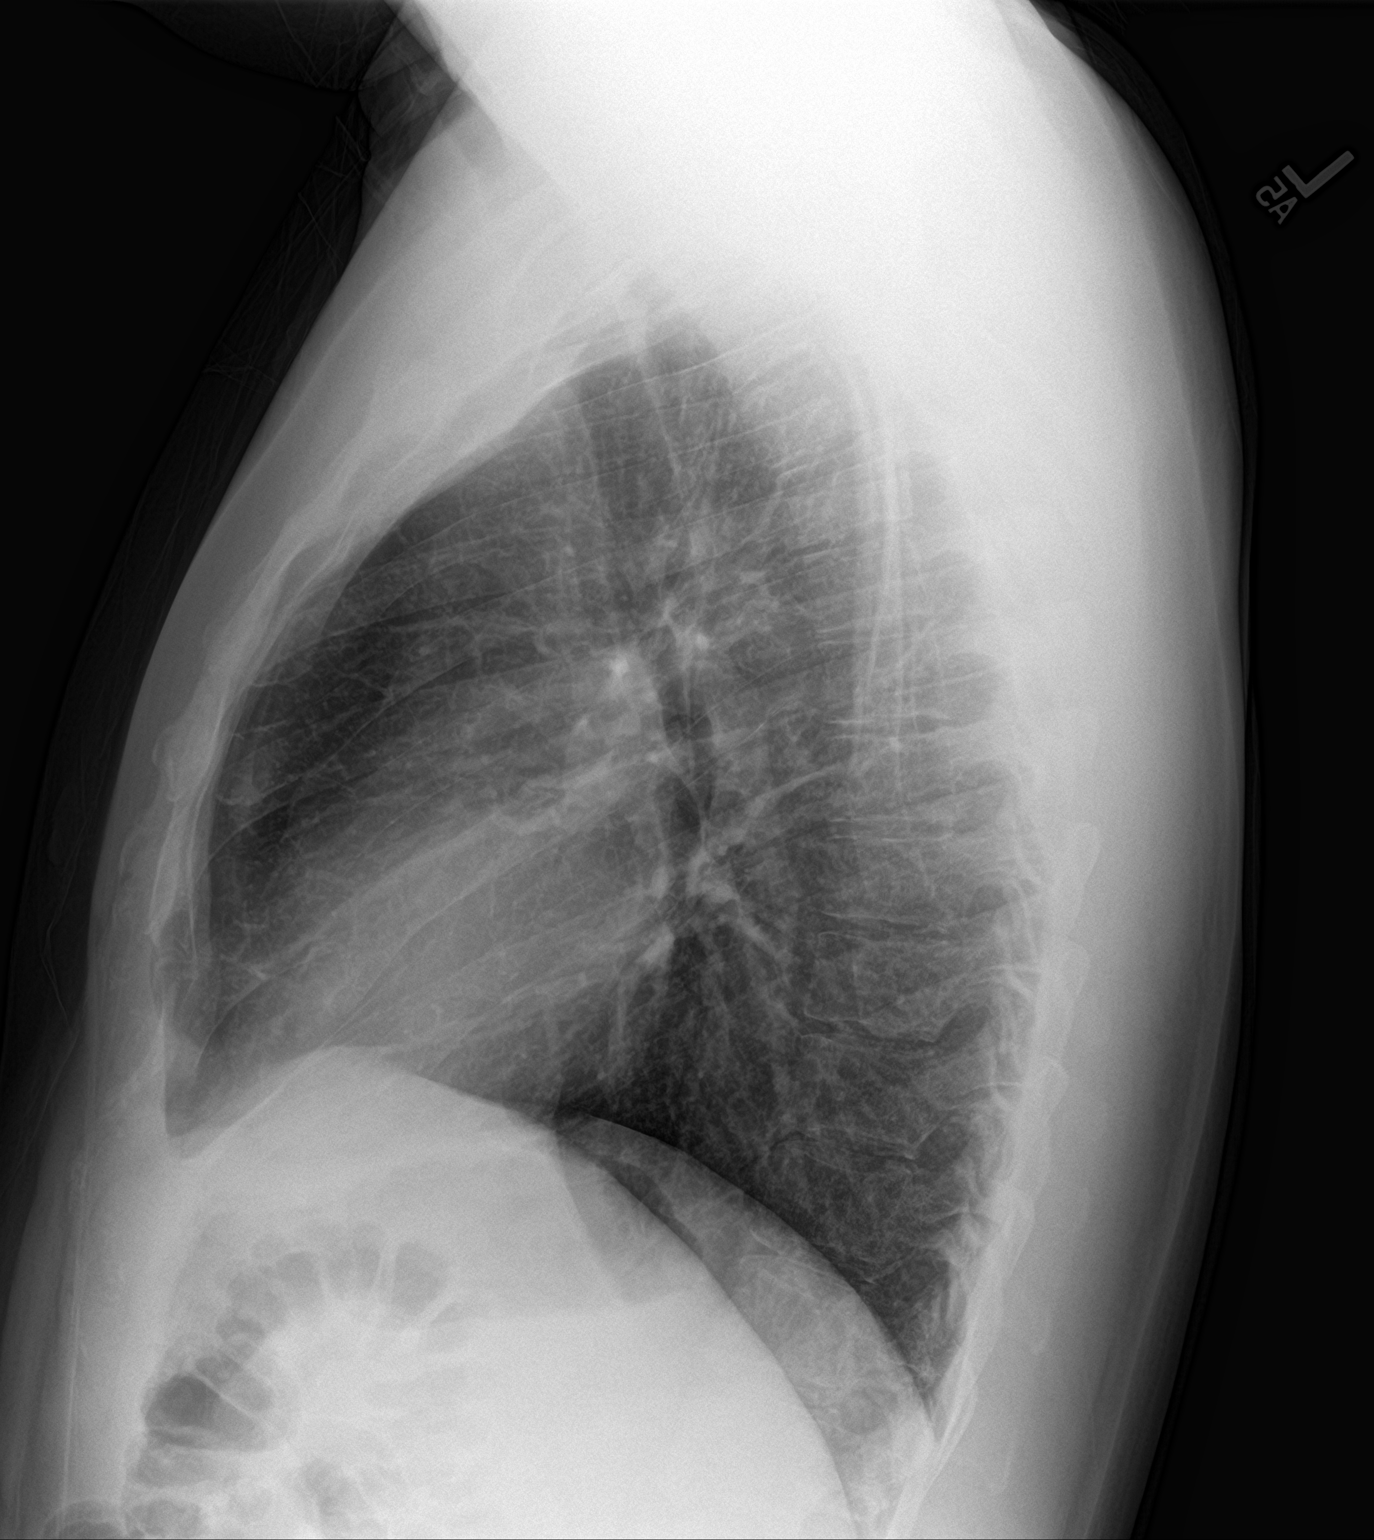

[2 of 2 positions shown; findings below may reference images not displayed]

FINDINGS: The heart size and mediastinal contours are within normal limits.
Both lungs are clear. The visualized skeletal structures are
unremarkable.
IMPRESSION: Normal exam.
